# Patient Record
Sex: Female | Born: 2000 | Race: Black or African American | Hispanic: No | Marital: Single | Smoking: Never smoker
Health system: Southern US, Community
[De-identification: ages and names within clinical notes are randomized; demographics above are authoritative.]

## PROBLEM LIST (undated history)

## (undated) ENCOUNTER — Emergency Department (HOSPITAL_COMMUNITY): Payer: Medicaid Other

---
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
## 2019-04-06 ENCOUNTER — Other Ambulatory Visit: Payer: Self-pay

## 2019-04-06 ENCOUNTER — Emergency Department (HOSPITAL_COMMUNITY)
Admission: EM | Admit: 2019-04-06 | Discharge: 2019-04-06 | Disposition: A | Discharge status: Home / Self Care | Payer: Medicaid Other | Attending: Emergency Medicine | Admitting: Emergency Medicine

## 2019-04-06 DIAGNOSIS — W268XXA Contact with other sharp object(s), not elsewhere classified, initial encounter: Secondary | ICD-10-CM | POA: Diagnosis not present

## 2019-04-06 DIAGNOSIS — S61412A Laceration without foreign body of left hand, initial encounter: Secondary | ICD-10-CM | POA: Insufficient documentation

## 2019-04-06 DIAGNOSIS — S6992XA Unspecified injury of left wrist, hand and finger(s), initial encounter: Secondary | ICD-10-CM | POA: Diagnosis present

## 2019-04-06 DIAGNOSIS — Y929 Unspecified place or not applicable: Secondary | ICD-10-CM | POA: Diagnosis not present

## 2019-04-06 DIAGNOSIS — Y9389 Activity, other specified: Secondary | ICD-10-CM | POA: Insufficient documentation

## 2019-04-06 DIAGNOSIS — Y999 Unspecified external cause status: Secondary | ICD-10-CM | POA: Insufficient documentation

## 2019-04-06 MED ORDER — LIDOCAINE HCL (PF) 1 % IJ SOLN
5.0000 mL | Freq: Once | INTRAMUSCULAR | Status: AC
  Administered 2019-04-06: 5 mL
  Filled 2019-04-06: qty 5

## 2019-04-06 NOTE — Discharge Instructions (Signed)
Treatment: Keep your wound dry and dressing applied until this time tomorrow. After 24 hours, you may wash with warm soapy water. Dry and apply antibiotic ointment and clean dressing. Do this daily until your sutures are removed. ° °Follow-up: Please follow-up with your primary care provider or return to emergency department in 10 days for suture removal. Be aware of signs of infection: fever, increasing pain, redness, swelling, drainage from the area. Please call your primary care provider or return to emergency department if you develop any of these symptoms or if any of the sutures come out prior to removal. Please return to the emergency department if you develop any other new or worsening symptoms. ° °

## 2019-04-06 NOTE — ED Notes (Signed)
Clean patient hand and re wrapped it patient is resting with call bell in reach

## 2019-04-06 NOTE — ED Triage Notes (Signed)
Pt was fixing car and cut the knuckle of left ring finger on metal at aprox. 1130am today.

## 2019-04-06 NOTE — ED Provider Notes (Addendum)
[place] EMERGENCY DEPARTMENT Provider Note   CSN: [id number] Arrival date & time: 04/06/19  1319     History   Chief Complaint Chief Complaint  Patient presents with  . Cut on hand    HPI Kelly Burgess is a 18 y.o. female who presents with left hand laceration over a fourth, left MCP that occurred when she scraped her hand on a piece of metal while trying to tape car.  Patient denies any other injuries.  Bleeding controlled prior to arrival.  No numbness or tingling.  Full range of motion of the hand.  Tetanus is up-to-date.     HPI  No past medical history on file.  There are no active problems to display for this patient.   No past surgical history on file.   OB History   No obstetric history on file.      Home Medications    Prior to Admission medications   Medication Sig Start Date End Date Taking? Authorizing Provider  azithromycin (ZITHROMAX Z-PAK) 250 MG tablet 2 po day one, then 1 daily x 4 days 09/13/17   Veryl Speak, MD    Family History No family history on file.  Social History Social History   Tobacco Use  . Smoking status: Never Smoker  . Smokeless tobacco: Never Used  Substance Use Topics  . Alcohol use: No    Frequency: Never  . Drug use: No     Allergies   Penicillins   Review of Systems Review of Systems  Skin: Positive for wound.  Neurological: Negative for numbness.     Physical Exam Updated Vital Signs BP 122/76 (BP Location: Right Arm)   Pulse 76   Temp 98.6 F (37 C) (Oral)   Resp 18   Ht 5\' 7"  (1.702 m)   Wt 52.2 kg   LMP 03/30/2019 (Approximate)   SpO2 100%   BMI 18.01 kg/m   Physical Exam Vitals signs and nursing note reviewed.  Constitutional:      General: She is not in acute distress.    Appearance: She is well-developed. She is not diaphoretic.  HENT:     Head: Normocephalic and atraumatic.     Mouth/Throat:     Pharynx: No oropharyngeal exudate.  Eyes:     General: No  scleral icterus.       Right eye: No discharge.        Left eye: No discharge.     Conjunctiva/sclera: Conjunctivae normal.     Pupils: Pupils are equal, round, and reactive to light.  Neck:     Musculoskeletal: Normal range of motion and neck supple.     Thyroid: No thyromegaly.  Cardiovascular:     Rate and Rhythm: Normal rate and regular rhythm.     Heart sounds: Normal heart sounds. No murmur. No friction rub. No gallop.   Pulmonary:     Effort: Pulmonary effort is normal. No respiratory distress.     Breath sounds: Normal breath sounds. No stridor. No wheezing or rales.  Musculoskeletal:       Hands:     Comments: FROM of the L hand with flexion and extension of the 4th MCP; sensation intact, cap refill <2 secs  Lymphadenopathy:     Cervical: No cervical adenopathy.  Skin:    General: Skin is warm and dry.     Coloration: Skin is not pale.     Findings: No rash.  Neurological:     Mental Status: She  is alert.     Coordination: Coordination normal.      ED Treatments / Results  Labs (all labs ordered are listed, but only abnormal results are displayed) Labs Reviewed - No data to display  EKG None  Radiology No results found.  Procedures .Marland Kitchen.Laceration Repair  Date/Time: 04/06/2019 7:41 PM Performed by: Emi HolesLaw, Keina Mutch M, PA-C Authorized by: Emi HolesLaw, Liat Mayol M, PA-C   Consent:    Consent obtained:  Verbal   Consent given by:  Patient   Risks discussed:  Infection and pain   Alternatives discussed:  No treatment Anesthesia (see MAR for exact dosages):    Anesthesia method:  Local infiltration   Local anesthetic:  Lidocaine 1% w/o epi Laceration details:    Location:  Hand   Hand location:  L hand, dorsum   Length (cm):  1   Depth (mm):  2 Repair type:    Repair type:  Simple Pre-procedure details:    Preparation:  Patient was prepped and draped in usual sterile fashion Exploration:    Hemostasis achieved with:  Direct pressure   Wound exploration: wound  explored through full range of motion     Wound extent: no foreign bodies/material noted, no muscle damage noted, no nerve damage noted and no tendon damage noted     Contaminated: no   Treatment:    Area cleansed with:  Saline   Amount of cleaning:  Standard   Irrigation solution:  Sterile saline   Irrigation volume:  100cc   Irrigation method:  Syringe   Visualized foreign bodies/material removed: no   Skin repair:    Repair method:  Sutures   Suture size:  4-0   Wound skin closure material used: Ethilon.   Suture technique:  Simple interrupted   Number of sutures:  2 Approximation:    Approximation:  Close Post-procedure details:    Dressing:  Antibiotic ointment and non-adherent dressing   Patient tolerance of procedure:  Tolerated well, no immediate complications   (including critical care time)  Medications Ordered in ED Medications  lidocaine (PF) (XYLOCAINE) 1 % injection 5 mL (5 mLs Infiltration Given by Other 04/06/19 1434)     Initial Impression / Assessment and Plan / ED Course  I have reviewed the triage vital signs and the nursing notes.  Pertinent labs & imaging results that were available during my care of the patient were reviewed by me and considered in my medical decision making (see chart for details).        Tetanus UTD. Laceration occurred < 12 hours prior to repair. Discussed laceration care with pt and answered questions. Pt to f-u for suture removal in 10 days and wound check sooner should there be signs of dehiscence or infection. Pt is hemodynamically stable with no complaints prior to dc.     Final Clinical Impressions(s) / ED Diagnoses   Final diagnoses:  Laceration of left hand without foreign body, initial encounter    ED Discharge Orders    None           Emi HolesLaw, Celester Lech M, PA-C 04/06/19 1942    Alvira MondaySchlossman, Erin, MD 04/07/19 2050

## 2022-02-02 ENCOUNTER — Encounter (HOSPITAL_BASED_OUTPATIENT_CLINIC_OR_DEPARTMENT_OTHER): Payer: Self-pay | Admitting: Emergency Medicine

## 2022-02-02 ENCOUNTER — Other Ambulatory Visit: Payer: Self-pay

## 2022-02-02 ENCOUNTER — Emergency Department (HOSPITAL_BASED_OUTPATIENT_CLINIC_OR_DEPARTMENT_OTHER)
Admission: EM | Admit: 2022-02-02 | Discharge: 2022-02-02 | Disposition: A | Discharge status: Home / Self Care | Payer: Medicaid Other | Attending: Emergency Medicine | Admitting: Emergency Medicine

## 2022-02-02 DIAGNOSIS — H6121 Impacted cerumen, right ear: Secondary | ICD-10-CM

## 2022-02-02 DIAGNOSIS — H9201 Otalgia, right ear: Secondary | ICD-10-CM | POA: Diagnosis present

## 2022-02-02 MED ORDER — DOCUSATE SODIUM 50 MG/5ML PO LIQD
100.0000 mg | Freq: Once | ORAL | Status: AC
  Administered 2022-02-02: 100 mg
  Filled 2022-02-02: qty 10

## 2022-02-02 NOTE — ED Triage Notes (Signed)
Pt reports intermittent R ear pain and difficulty hearing for the past few weeks that is gradually getting worse.

## 2022-02-02 NOTE — ED Provider Notes (Signed)
[facility] EMERGENCY DEPARTMENT Provider Note   CSN: [id number] Arrival date & time: 02/02/22  0920     History  Chief Complaint  Patient presents with   Otalgia    Kelly Burgess is a 21 y.o. female history of allergies who presents emergency department for evaluation of right ear pain and muffled hearing for the past few weeks that is getting worse over the last few days.  Patient states that she takes a prescription allergy medication, however she was out of it for approximately 1 week and is unsure if this is what caused her symptoms.  A few weeks ago, she also noted that she had increased postnasal drip and sinus congestion although this seems to be improving as well.  She states symptoms seem to be more related to allergies than to to illness.  She denies fevers, chills, shortness of breath per ROS.  Otalgia      Home Medications Prior to Admission medications   Medication Sig Start Date End Date Taking? Authorizing Provider  azithromycin (ZITHROMAX Z-PAK) 250 MG tablet 2 po day one, then 1 daily x 4 days 09/13/17   Geoffery Lyons, MD      Allergies    Penicillins    Review of Systems   Review of Systems  HENT:  Positive for ear pain.     Physical Exam Updated Vital Signs BP 120/90   Pulse 80   Temp 99.3 F (37.4 C) (Oral)   Resp 16   SpO2 100%  Physical Exam Vitals and nursing note reviewed.  Constitutional:      General: She is not in acute distress.    Appearance: She is not ill-appearing.  HENT:     Head: Atraumatic.     Right Ear: No mastoid tenderness.     Left Ear: No mastoid tenderness.     Ears:     Comments: Negative ear tug, negative tragus test bilaterally.  Moderate amounts of impacted wax of the right ear canal, impacting visibility of the tympanic membrane.  Minimal amounts of wax noted in the left eardrum with visible tympanic membrane.  TM is not erythematous, nonbulging Eyes:     Conjunctiva/sclera: Conjunctivae normal.   Cardiovascular:     Rate and Rhythm: Normal rate and regular rhythm.     Pulses: Normal pulses.     Heart sounds: No murmur heard. Pulmonary:     Effort: Pulmonary effort is normal. No respiratory distress.     Breath sounds: Normal breath sounds.  Abdominal:     General: Abdomen is flat. There is no distension.     Palpations: Abdomen is soft.     Tenderness: There is no abdominal tenderness.  Musculoskeletal:        General: Normal range of motion.     Cervical back: Normal range of motion.  Skin:    General: Skin is warm and dry.     Capillary Refill: Capillary refill takes less than 2 seconds.  Neurological:     General: No focal deficit present.     Mental Status: She is alert.  Psychiatric:        Mood and Affect: Mood normal.     ED Results / Procedures / Treatments   Labs (all labs ordered are listed, but only abnormal results are displayed) Labs Reviewed - No data to display  EKG None  Radiology No results found.  Procedures Procedures    Medications Ordered in ED Medications  docusate (COLACE) 50 MG/5ML liquid  100 mg (100 mg Per Tube Given 02/02/22 0947)    ED Course/ Medical Decision Making/ A&P                           Medical Decision Making Risk OTC drugs.   21 year old female presents emergency department for evaluation of intermittent ear pain and muffled hearing of the right ear x2 weeks that has worsened in the last few weeks.  Ddx includes EOM, AOM, congestion, viral URI, cerumen impaction. Vitals are without significant abnormality.  On exam, visibility of the right eardrum is impeded by moderate amounts of impacted cerumen.  TM is not visible.  Left eardrum is partially visible and TM is normal.  I discussed with patient option of purchasing Debrox ear cleaning solution over-the-counter and attempting earwax removal at home versus ear irrigation with Colace solution here in the emergency department, and patient would prefer to attempt ear  irrigation here in the emergency department. Pt successfully had lady-bug sized chunk of waxed irrigated with symptomatic improvement. TM now visible without bulging or erythema. Mild fluid behind ears. Advised to continue allergy medications and recommend Debrox otc for regular maintenance. Pt expresses understanding and is amenable to plan. Dc'd home in good condition. Final Clinical Impression(s) / ED Diagnoses Final diagnoses:  Impacted cerumen of right ear    Rx / DC Orders ED Discharge Orders     None         Janell Quiet, PA-C 02/02/22 1017    Cathren Laine, MD 02/02/22 1549

## 2022-02-02 NOTE — Discharge Instructions (Addendum)
Please purchase Debrox ear cleaning solution over-the-counter to clean your ears out regularly to prevent waxy buildup.  Please avoid using Q-tips in the ears as this can worsen impaction. Continue to take your regular allergy medications.

## 2022-08-24 ENCOUNTER — Emergency Department (HOSPITAL_BASED_OUTPATIENT_CLINIC_OR_DEPARTMENT_OTHER)
Admission: EM | Admit: 2022-08-24 | Discharge: 2022-08-25 | Disposition: A | Discharge status: Home / Self Care | Payer: Medicaid Other | Attending: Emergency Medicine | Admitting: Emergency Medicine

## 2022-08-24 ENCOUNTER — Encounter (HOSPITAL_BASED_OUTPATIENT_CLINIC_OR_DEPARTMENT_OTHER): Payer: Self-pay | Admitting: Emergency Medicine

## 2022-08-24 DIAGNOSIS — K529 Noninfective gastroenteritis and colitis, unspecified: Secondary | ICD-10-CM | POA: Insufficient documentation

## 2022-08-24 DIAGNOSIS — Z1152 Encounter for screening for COVID-19: Secondary | ICD-10-CM | POA: Insufficient documentation

## 2022-08-24 DIAGNOSIS — R112 Nausea with vomiting, unspecified: Secondary | ICD-10-CM | POA: Diagnosis present

## 2022-08-24 LAB — COMPREHENSIVE METABOLIC PANEL
ALT: 21 U/L (ref 0–44)
AST: 25 U/L (ref 15–41)
Albumin: 4.6 g/dL (ref 3.5–5.0)
Alkaline Phosphatase: 56 U/L (ref 38–126)
Anion gap: 9 (ref 5–15)
BUN: 12 mg/dL (ref 6–20)
CO2: 24 mmol/L (ref 22–32)
Calcium: 8.8 mg/dL — ABNORMAL LOW (ref 8.9–10.3)
Chloride: 102 mmol/L (ref 98–111)
Creatinine, Ser: 0.7 mg/dL (ref 0.44–1.00)
GFR, Estimated: >60 mL/min (ref >60)
Glucose, Bld: 106 mg/dL — ABNORMAL HIGH (ref 70–99)
Potassium: 3.3 mmol/L — ABNORMAL LOW (ref 3.5–5.1)
Sodium: 135 mmol/L (ref 135–145)
Total Bilirubin: 0.6 mg/dL (ref 0.3–1.2)
Total Protein: 8.9 g/dL — ABNORMAL HIGH (ref 6.5–8.1)

## 2022-08-24 LAB — CBC WITH DIFFERENTIAL/PLATELET
Abs Immature Granulocytes: 0.07 10*3/uL (ref 0.00–0.07)
Basophils Absolute: 0 10*3/uL (ref 0.0–0.1)
Basophils Relative: 0 %
Eosinophils Absolute: 0.1 10*3/uL (ref 0.0–0.5)
Eosinophils Relative: 1 %
HCT: 42.7 % (ref 36.0–46.0)
Hemoglobin: 14.5 g/dL (ref 12.0–15.0)
Immature Granulocytes: 1 %
Lymphocytes Relative: 11 %
Lymphs Abs: 1.6 10*3/uL (ref 0.7–4.0)
MCH: 32.8 pg (ref 26.0–34.0)
MCHC: 34 g/dL (ref 30.0–36.0)
MCV: 96.6 fL (ref 80.0–100.0)
Monocytes Absolute: 0.6 10*3/uL (ref 0.1–1.0)
Monocytes Relative: 4 %
Neutro Abs: 13.1 10*3/uL — ABNORMAL HIGH (ref 1.7–7.7)
Neutrophils Relative %: 83 %
Platelets: 242 10*3/uL (ref 150–400)
RBC: 4.42 MIL/uL (ref 3.87–5.11)
RDW: 11.9 % (ref 11.5–15.5)
WBC: 15.4 10*3/uL — ABNORMAL HIGH (ref 4.0–10.5)
nRBC: 0 % (ref 0.0–0.2)

## 2022-08-24 LAB — LIPASE, BLOOD: Lipase: 30 U/L (ref 11–51)

## 2022-08-24 LAB — RESP PANEL BY RT-PCR (RSV, FLU A&B, COVID)  RVPGX2
Influenza A by PCR: NEGATIVE
Influenza B by PCR: NEGATIVE
Resp Syncytial Virus by PCR: NEGATIVE
SARS Coronavirus 2 by RT PCR: NEGATIVE

## 2022-08-24 LAB — PREGNANCY, URINE: Preg Test, Ur: NEGATIVE

## 2022-08-24 MED ORDER — ONDANSETRON 4 MG PO TBDP
ORAL_TABLET | ORAL | Status: AC
  Filled 2022-08-24: qty 1

## 2022-08-24 MED ORDER — METOCLOPRAMIDE HCL 5 MG/ML IJ SOLN
10.0000 mg | Freq: Once | INTRAMUSCULAR | Status: AC | PRN
  Administered 2022-08-24: 10 mg via INTRAVENOUS
  Filled 2022-08-24: qty 2

## 2022-08-24 MED ORDER — SODIUM CHLORIDE 0.9 % IV BOLUS
1000.0000 mL | Freq: Once | INTRAVENOUS | Status: AC
  Administered 2022-08-24: 1000 mL via INTRAVENOUS

## 2022-08-24 MED ORDER — ONDANSETRON 4 MG PO TBDP
4.0000 mg | ORAL_TABLET | Freq: Once | ORAL | Status: AC | PRN
  Administered 2022-08-24: 4 mg via ORAL

## 2022-08-24 NOTE — ED Triage Notes (Addendum)
Pt with NVD x 3d; c/o generalized abd pain when vomiting or having diarrhea

## 2022-08-24 NOTE — ED Provider Notes (Signed)
[facility] Provider Note: Georgena Spurling, MD, FACEP  CSN: [id number] MRN: [id number] ARRIVAL: 08/24/22 at 2053 ROOM: [facility]  N/V/D   HISTORY OF PRESENT ILLNESS  08/24/22 11:25 PM Kelly Burgess is a 22 y.o. female with 3 days of nausea, vomiting and diarrhea.  She has not been able to keep anything down.  When she attempts to eat it usually comes back up.  She is having generalized abdominal pain but is not sure if this is abdominal wall pain from throwing up or coming from inside her abdomen.  It is not crampy in nature.   History reviewed. No pertinent past medical history.  History reviewed. No pertinent surgical history.  History reviewed. No pertinent family history.  Social History   Tobacco Use   Smoking status: Never   Smokeless tobacco: Never  Substance Use Topics   Alcohol use: No   Drug use: No    Prior to Admission medications   Medication Sig Start Date End Date Taking? Authorizing Provider  ondansetron (ZOFRAN-ODT) 8 MG disintegrating tablet Take 1 tablet (8 mg total) by mouth every 8 (eight) hours as needed for vomiting or nausea. 08/25/22  Yes Calianne Larue, MD    Allergies Penicillins   REVIEW OF SYSTEMS  Negative except as noted here or in the History of Present Illness.   PHYSICAL EXAMINATION  Initial Vital Signs Blood pressure 117/77, pulse 96, temperature 97.9 F (36.6 C), temperature source Oral, resp. rate 16, height 5\' 6"  (1.676 m), weight 53.1 kg, last menstrual period 07/22/2022, SpO2 100 %.  Examination General: Well-developed, well-nourished female in no acute distress; appearance consistent with age of record HENT: normocephalic; atraumatic Eyes: Normal appearance Neck: supple Heart: regular rate and rhythm Lungs: clear to auscultation bilaterally Abdomen: soft; nondistended; diffusely tender (abdominal wall musculature); bowel sounds present Extremities: No deformity; full range of motion; pulses  normal Neurologic: Awake, alert and oriented; motor function intact in all extremities and symmetric; no facial droop Skin: Warm and dry Psychiatric: Normal mood and affect   RESULTS  Summary of this visit's results, reviewed and interpreted by myself:   EKG Interpretation  Date/Time:    Ventricular Rate:    PR Interval:    QRS Duration:   QT Interval:    QTC Calculation:   R Axis:     Text Interpretation:         Laboratory Studies: Results for orders placed or performed during the hospital encounter of 08/24/22 (from the past 24 hour(s))  Resp panel by RT-PCR (RSV, Flu A&B, Covid) Anterior Nasal Swab     Status: None   Collection Time: 08/24/22  9:12 PM   Specimen: Anterior Nasal Swab  Result Value Ref Range   SARS Coronavirus 2 by RT PCR NEGATIVE NEGATIVE   Influenza A by PCR NEGATIVE NEGATIVE   Influenza B by PCR NEGATIVE NEGATIVE   Resp Syncytial Virus by PCR NEGATIVE NEGATIVE  Lipase, blood     Status: None   Collection Time: 08/24/22 11:25 PM  Result Value Ref Range   Lipase 30 11 - 51 U/L  Comprehensive metabolic panel     Status: Abnormal   Collection Time: 08/24/22 11:25 PM  Result Value Ref Range   Sodium 135 135 - 145 mmol/L   Potassium 3.3 (L) 3.5 - 5.1 mmol/L   Chloride 102 98 - 111 mmol/L   CO2 24 22 - 32 mmol/L   Glucose, Bld 106 (H) 70 - 99 mg/dL  BUN 12 6 - 20 mg/dL   Creatinine, Ser 0.70 0.44 - 1.00 mg/dL   Calcium 8.8 (L) 8.9 - 10.3 mg/dL   Total Protein 8.9 (H) 6.5 - 8.1 g/dL   Albumin 4.6 3.5 - 5.0 g/dL   AST 25 15 - 41 U/L   ALT 21 0 - 44 U/L   Alkaline Phosphatase 56 38 - 126 U/L   Total Bilirubin 0.6 0.3 - 1.2 mg/dL   GFR, Estimated >60 >60 mL/min   Anion gap 9 5 - 15  CBC with Differential     Status: Abnormal   Collection Time: 08/24/22 11:31 PM  Result Value Ref Range   WBC 15.4 (H) 4.0 - 10.5 K/uL   RBC 4.42 3.87 - 5.11 MIL/uL   Hemoglobin 14.5 12.0 - 15.0 g/dL   HCT 42.7 36.0 - 46.0 %   MCV 96.6 80.0 - 100.0 fL   MCH 32.8  26.0 - 34.0 pg   MCHC 34.0 30.0 - 36.0 g/dL   RDW 11.9 11.5 - 15.5 %   Platelets 242 150 - 400 K/uL   nRBC 0.0 0.0 - 0.2 %   Neutrophils Relative % 83 %   Neutro Abs 13.1 (H) 1.7 - 7.7 K/uL   Lymphocytes Relative 11 %   Lymphs Abs 1.6 0.7 - 4.0 K/uL   Monocytes Relative 4 %   Monocytes Absolute 0.6 0.1 - 1.0 K/uL   Eosinophils Relative 1 %   Eosinophils Absolute 0.1 0.0 - 0.5 K/uL   Basophils Relative 0 %   Basophils Absolute 0.0 0.0 - 0.1 K/uL   Immature Granulocytes 1 %   Abs Immature Granulocytes 0.07 0.00 - 0.07 K/uL  Pregnancy, urine     Status: None   Collection Time: 08/24/22 11:35 PM  Result Value Ref Range   Preg Test, Ur NEGATIVE NEGATIVE   Imaging Studies: No results found.  ED COURSE and MDM  Nursing notes, initial and subsequent vitals signs, including pulse oximetry, reviewed and interpreted by myself.  Vitals:   08/25/22 0000 08/25/22 0030 08/25/22 0100 08/25/22 0130  BP: 110/70 (!) 102/56 103/60 130/86  Pulse: 75 80 82 81  Resp: 16 16  18   Temp:  98 F (36.7 C)    TempSrc:  Oral    SpO2: 96% 96% 99% 99%  Weight:      Height:       Medications  ondansetron (ZOFRAN-ODT) disintegrating tablet 4 mg (4 mg Oral Given 08/24/22 2307)  sodium chloride 0.9 % bolus 1,000 mL (0 mLs Intravenous Stopped 08/25/22 0146)  metoCLOPramide (REGLAN) injection 10 mg (10 mg Intravenous Given 08/24/22 2349)   1:47 AM Patient feeling better after IV fluids and medications.  She has been drinking fluids without vomiting.  She states she is ready to go home.  Presentation is consistent with a gastroenteritis, likely viral.   PROCEDURES  Procedures   ED DIAGNOSES     ICD-10-CM   1. Gastroenteritis  K52.9          Nylene Inlow, MD 08/25/22 [phone]

## 2022-08-25 MED ORDER — ONDANSETRON 8 MG PO TBDP: 8.0000 mg | ORAL_TABLET | Freq: Three times a day (TID) | ORAL | 0 refills | Status: AC | PRN
